# Patient Record
Sex: Female | Born: 1980 | Race: Black or African American | Hispanic: No | Smoking: Never smoker
Health system: Southern US, Community
[De-identification: ages and names within clinical notes are randomized; demographics above are authoritative.]

## PROBLEM LIST (undated history)

## (undated) DIAGNOSIS — Z789 Other specified health status: Secondary | ICD-10-CM

## (undated) HISTORY — PX: NO PAST SURGERIES: SHX2092

---

## 2006-10-07 ENCOUNTER — Emergency Department (HOSPITAL_COMMUNITY): Admission: EM | Admit: 2006-10-07 | Discharge: 2006-10-07 | Payer: Self-pay | Admitting: Family Medicine

## 2006-10-11 ENCOUNTER — Emergency Department (HOSPITAL_COMMUNITY): Admission: EM | Admit: 2006-10-11 | Discharge: 2006-10-11 | Payer: Self-pay | Admitting: Emergency Medicine

## 2006-10-19 ENCOUNTER — Emergency Department (HOSPITAL_COMMUNITY): Admission: EM | Admit: 2006-10-19 | Discharge: 2006-10-20 | Payer: Self-pay | Admitting: Emergency Medicine

## 2007-08-31 ENCOUNTER — Inpatient Hospital Stay (HOSPITAL_COMMUNITY): Admission: AD | Admit: 2007-08-31 | Discharge: 2007-08-31 | Payer: Self-pay | Admitting: Obstetrics & Gynecology

## 2008-04-01 ENCOUNTER — Inpatient Hospital Stay (HOSPITAL_COMMUNITY): Admission: AD | Admit: 2008-04-01 | Discharge: 2008-04-01 | Payer: Self-pay | Admitting: Obstetrics & Gynecology

## 2008-04-16 ENCOUNTER — Inpatient Hospital Stay (HOSPITAL_COMMUNITY): Admission: AD | Admit: 2008-04-16 | Discharge: 2008-04-16 | Payer: Self-pay | Admitting: Family Medicine

## 2008-04-20 ENCOUNTER — Inpatient Hospital Stay (HOSPITAL_COMMUNITY): Admission: AD | Admit: 2008-04-20 | Discharge: 2008-04-20 | Payer: Self-pay | Admitting: Obstetrics & Gynecology

## 2008-07-13 ENCOUNTER — Inpatient Hospital Stay (HOSPITAL_COMMUNITY): Admission: AD | Admit: 2008-07-13 | Discharge: 2008-07-14 | Payer: Self-pay | Admitting: Obstetrics & Gynecology

## 2009-06-18 ENCOUNTER — Emergency Department (HOSPITAL_COMMUNITY): Admission: EM | Admit: 2009-06-18 | Discharge: 2009-06-18 | Payer: Self-pay | Admitting: Emergency Medicine

## 2010-10-12 LAB — WET PREP, GENITAL: Trich, Wet Prep: NONE SEEN

## 2010-10-12 LAB — GC/CHLAMYDIA PROBE AMP, GENITAL: GC Probe Amp, Genital: NEGATIVE

## 2010-11-07 ENCOUNTER — Emergency Department (HOSPITAL_COMMUNITY)
Admission: EM | Admit: 2010-11-07 | Discharge: 2010-11-07 | Disposition: A | Discharge status: Home / Self Care | Payer: 59 | Attending: Emergency Medicine | Admitting: Emergency Medicine

## 2010-11-07 DIAGNOSIS — R51 Headache: Secondary | ICD-10-CM | POA: Insufficient documentation

## 2011-03-22 LAB — URINALYSIS, ROUTINE W REFLEX MICROSCOPIC
Glucose, UA: NEGATIVE
Protein, ur: 30 — AB
pH: 5.5

## 2011-03-22 LAB — URINE MICROSCOPIC-ADD ON

## 2011-03-29 LAB — POCT PREGNANCY, URINE: Preg Test, Ur: NEGATIVE

## 2011-03-29 LAB — URINALYSIS, ROUTINE W REFLEX MICROSCOPIC
Bilirubin Urine: NEGATIVE
Glucose, UA: NEGATIVE
Ketones, ur: NEGATIVE
Nitrite: NEGATIVE
pH: 6

## 2011-03-29 LAB — URINE MICROSCOPIC-ADD ON

## 2011-03-29 LAB — GC/CHLAMYDIA PROBE AMP, GENITAL
Chlamydia, DNA Probe: NEGATIVE
GC Probe Amp, Genital: NEGATIVE

## 2011-03-29 LAB — WET PREP, GENITAL
Clue Cells Wet Prep HPF POC: NONE SEEN
Trich, Wet Prep: NONE SEEN

## 2011-03-29 LAB — HERPES SIMPLEX VIRUS CULTURE

## 2011-06-01 ENCOUNTER — Inpatient Hospital Stay (HOSPITAL_COMMUNITY)
Admission: AD | Admit: 2011-06-01 | Discharge: 2011-06-01 | Disposition: A | Discharge status: Home / Self Care | Payer: 59 | Source: Ambulatory Visit | Attending: Obstetrics & Gynecology | Admitting: Obstetrics & Gynecology

## 2011-06-01 ENCOUNTER — Encounter (HOSPITAL_COMMUNITY): Payer: Self-pay | Admitting: *Deleted

## 2011-06-01 DIAGNOSIS — B9789 Other viral agents as the cause of diseases classified elsewhere: Secondary | ICD-10-CM | POA: Insufficient documentation

## 2011-06-01 DIAGNOSIS — B349 Viral infection, unspecified: Secondary | ICD-10-CM

## 2011-06-01 HISTORY — DX: Other specified health status: Z78.9

## 2011-06-01 NOTE — Progress Notes (Signed)
Patient states she has been having a sore throat and cough on 12-2, started having pain in the right ear yesterday.

## 2011-06-01 NOTE — ED Provider Notes (Signed)
History   Pt presents today c/o "not feeling well." She states on Sunday she began with a sore throat and now she has an earache. She denies fever, productive cough, chest pain, abd pain, or any other sx. She states her throat feels better than it did on Sunday.  Chief Complaint  Patient presents with  . Otalgia  . Sore Throat   Patient is a 30 y.o. female presenting with ear pain and pharyngitis.  Otalgia Pertinent negatives include no headaches, no abdominal pain, no diarrhea and no vomiting.  Sore Throat Pertinent negatives include no abdominal pain, chest pain, chills, fever, headaches, nausea or vomiting.    OB History    Grav Para Term Preterm Abortions TAB SAB Ect Mult Living   1    1 1     0      Past Medical History  Diagnosis Date  . No pertinent past medical history     Past Surgical History  Procedure Date  . No past surgeries     No family history on file.  History  Substance Use Topics  . Smoking status: Never Smoker   . Smokeless tobacco: Not on file  . Alcohol Use: No    Allergies: Allergies not on file  No prescriptions prior to admission    Review of Systems  Constitutional: Positive for malaise/fatigue. Negative for fever and chills.  HENT: Positive for ear pain.   Respiratory: Negative for hemoptysis, sputum production, shortness of breath and wheezing.   Cardiovascular: Negative for chest pain and palpitations.  Gastrointestinal: Negative for nausea, vomiting, abdominal pain, diarrhea and constipation.  Genitourinary: Negative for dysuria, urgency, frequency and hematuria.  Neurological: Negative for dizziness and headaches.  Psychiatric/Behavioral: Negative for depression and suicidal ideas.   Physical Exam   Blood pressure 116/71, temperature 99.5 F (37.5 C), temperature source Oral, resp. rate 20, height 5\' 7"  (1.702 m), weight 168 lb 6.4 oz (76.386 kg), last menstrual period 05/18/2011, SpO2 98.00%.  Physical Exam  Nursing note and  vitals reviewed. Constitutional: She is oriented to person, place, and time. She appears well-developed and well-nourished. No distress.  HENT:  Head: Normocephalic and atraumatic.  Right Ear: External ear normal.  Left Ear: External ear normal.  Nose: Nose normal.  Mouth/Throat: Oropharynx is clear and moist. No oropharyngeal exudate.  Eyes: EOM are normal. Pupils are equal, round, and reactive to light.  Cardiovascular: Normal rate, regular rhythm and normal heart sounds.  Exam reveals no gallop and no friction rub.   No murmur heard. Respiratory: Effort normal and breath sounds normal. No respiratory distress. She has no wheezes. She has no rales. She exhibits no tenderness.  GI: Soft. She exhibits no distension. There is no tenderness. There is no rebound and no guarding.  Neurological: She is alert and oriented to person, place, and time.  Skin: Skin is warm and dry. She is not diaphoretic.  Psychiatric: She has a normal mood and affect. Her behavior is normal. Judgment and thought content normal.    MAU Course  Procedures    Assessment and Plan  Viral syndrome: discussed with pt at length. She will f/u with her PCP. Discussed instructions and precautions.  Clinton Gallant. Tamim Skog III, DrHSc, MPAS, PA-C  06/01/2011, 9:40 AM   Henrietta Hoover, PA 06/01/11 0943  Henrietta Hoover, PA 06/01/11 0945

## 2011-07-21 ENCOUNTER — Encounter (HOSPITAL_COMMUNITY): Payer: Self-pay | Admitting: *Deleted

## 2011-07-21 ENCOUNTER — Emergency Department (HOSPITAL_COMMUNITY)
Admission: EM | Admit: 2011-07-21 | Discharge: 2011-07-21 | Disposition: A | Discharge status: Home / Self Care | Payer: 59 | Attending: Emergency Medicine | Admitting: Emergency Medicine

## 2011-07-21 DIAGNOSIS — J3489 Other specified disorders of nose and nasal sinuses: Secondary | ICD-10-CM | POA: Insufficient documentation

## 2011-07-21 DIAGNOSIS — R059 Cough, unspecified: Secondary | ICD-10-CM | POA: Insufficient documentation

## 2011-07-21 DIAGNOSIS — R070 Pain in throat: Secondary | ICD-10-CM | POA: Insufficient documentation

## 2011-07-21 DIAGNOSIS — IMO0001 Reserved for inherently not codable concepts without codable children: Secondary | ICD-10-CM | POA: Insufficient documentation

## 2011-07-21 DIAGNOSIS — R5383 Other fatigue: Secondary | ICD-10-CM | POA: Insufficient documentation

## 2011-07-21 DIAGNOSIS — R5381 Other malaise: Secondary | ICD-10-CM | POA: Insufficient documentation

## 2011-07-21 DIAGNOSIS — R05 Cough: Secondary | ICD-10-CM | POA: Insufficient documentation

## 2011-07-21 DIAGNOSIS — J111 Influenza due to unidentified influenza virus with other respiratory manifestations: Secondary | ICD-10-CM | POA: Insufficient documentation

## 2011-07-21 DIAGNOSIS — R6889 Other general symptoms and signs: Secondary | ICD-10-CM

## 2011-07-21 DIAGNOSIS — R509 Fever, unspecified: Secondary | ICD-10-CM | POA: Insufficient documentation

## 2011-07-21 DIAGNOSIS — H9209 Otalgia, unspecified ear: Secondary | ICD-10-CM | POA: Insufficient documentation

## 2011-07-21 DIAGNOSIS — R0789 Other chest pain: Secondary | ICD-10-CM | POA: Insufficient documentation

## 2011-07-21 MED ORDER — FLUTICASONE PROPIONATE 50 MCG/ACT NA SUSP: 2.0000 | Freq: Every day | NASAL | Status: AC

## 2011-07-21 MED ORDER — HYDROCODONE-HOMATROPINE 5-1.5 MG/5ML PO SYRP: 5.0000 mL | ORAL_SOLUTION | Freq: Four times a day (QID) | ORAL | Status: AC | PRN

## 2011-07-21 NOTE — ED Provider Notes (Signed)
Medical screening examination/treatment/procedure(s) were performed by non-physician practitioner and as supervising physician I was immediately available for consultation/collaboration.   Oma Marzan, MD 07/21/11 1538 

## 2011-07-21 NOTE — ED Notes (Signed)
Pt state she is having body aches, chills, body aches and ear aches

## 2011-07-21 NOTE — ED Provider Notes (Signed)
History     CSN: [id number]  Arrival date & time 07/21/11  1000   First MD Initiated Contact with Patient 07/21/11 1011      No chief complaint on file.   (Consider location/radiation/quality/duration/timing/severity/associated sxs/prior treatment) HPI Comments: Pt presents to the ED with complaints of flu-like symptoms of cough, congestion, sore throat, muscle aches, chills, fevers,left ear pain. . The patient states that the symptoms started Monday.  Pt has been around other sick contacts and did not get the flu shot this year. The patient denies headaches, neck pain, weakness, vision changes, severe abdominal pain, inability to eat or drink, difficulty breathing, SOB, wheezing, chest pain. The patient has tried cough medicine, NSAIDS, and rest but has only felt mild relief.    The history is provided by the patient.    Past Medical History  Diagnosis Date  . No pertinent past medical history     Past Surgical History  Procedure Date  . No past surgeries     No family history on file.  History  Substance Use Topics  . Smoking status: Never Smoker   . Smokeless tobacco: Not on file  . Alcohol Use: No    OB History    Grav Para Term Preterm Abortions TAB SAB Ect Mult Living   1    1 1     0      Review of Systems  Constitutional: Positive for fever, chills and fatigue.  HENT: Positive for congestion and rhinorrhea. Negative for ear pain, sore throat, sneezing, neck pain, neck stiffness, sinus pressure and tinnitus.   Eyes: Negative for visual disturbance.  Respiratory: Positive for cough and chest tightness.   Cardiovascular: Negative for chest pain and palpitations.  Gastrointestinal: Negative for nausea, vomiting, abdominal pain and diarrhea.  Genitourinary: Negative for dysuria.  Musculoskeletal: Positive for myalgias.  Skin: Negative for color change and rash.  Neurological: Negative for dizziness and weakness.  Hematological: Does not bruise/bleed easily.    Psychiatric/Behavioral: Negative for confusion.  All other systems reviewed and are negative.    Allergies  Penicillins  Home Medications   Current Outpatient Rx  Name Route Sig Dispense Refill  . NORGESTIM-ETH ESTRAD TRIPHASIC 0.18/0.215/0.25 MG-35 MCG PO TABS Oral Take 1 tablet by mouth daily.        BP 132/73  Pulse 90  Temp(Src) 98.9 F (37.2 C) (Oral)  Resp 18  SpO2 100%  LMP 07/18/2011  Physical Exam  Nursing note and vitals reviewed. Constitutional: She is oriented to person, place, and time. Vital signs are normal. She appears well-developed and well-nourished. No distress.  HENT:  Head: Normocephalic and atraumatic. No trismus in the jaw.  Right Ear: External ear normal. No drainage or tenderness. No mastoid tenderness.  Left Ear: External ear normal. No drainage or tenderness. No mastoid tenderness.  Nose: Nose normal. No rhinorrhea or sinus tenderness.  Mouth/Throat: Uvula is midline, oropharynx is clear and moist and mucous membranes are normal. No uvula swelling. No oropharyngeal exudate.  Eyes: Conjunctivae and EOM are normal. Right eye exhibits no discharge. Left eye exhibits no discharge. No scleral icterus.  Neck: Normal range of motion. Neck supple.  Cardiovascular: Normal rate, regular rhythm and normal heart sounds.   Pulmonary/Chest: Effort normal and breath sounds normal. No stridor. No respiratory distress. She has no wheezes. She exhibits tenderness.  Abdominal: Soft. There is no tenderness.  Musculoskeletal: Normal range of motion.  Neurological: She is alert and oriented to person, place, and time.  Skin: Skin  is warm and dry. No rash noted. She is not diaphoretic.  Psychiatric: She has a normal mood and affect. Her behavior is normal.    ED Course  Procedures (including critical care time)  Labs Reviewed - No data to display No results found.   No diagnosis found.    MDM  Flu like symptoms  Patient with symptoms consistent with  influenza.  Vitals are stable, low-grade fever.  No signs of dehydration, tolerating PO's.  Lungs are clear. Due to patient's presentation and physical exam a chest x-ray was not ordered bc likely diagnosis of flu.  Discussed the cost versus benefit of Tamiflu treatment with the patient.  The patient understands that symptoms are greater than the recommended 24-48 hour window of treatment.  Patient will be discharged with instructions to orally hydrate, rest, and use over-the-counter medications such as anti-inflammatories ibuprofen and Aleve for muscle aches and Tylenol for fever.  Patient will also be given a cough suppressant.       Jaci Carrel, [place] 07/21/11 1044

## 2011-08-02 ENCOUNTER — Emergency Department (HOSPITAL_COMMUNITY)
Admission: EM | Admit: 2011-08-02 | Discharge: 2011-08-02 | Disposition: A | Discharge status: Home / Self Care | Payer: 59 | Attending: Emergency Medicine | Admitting: Emergency Medicine

## 2011-08-02 ENCOUNTER — Encounter (HOSPITAL_COMMUNITY): Payer: Self-pay | Admitting: *Deleted

## 2011-08-02 DIAGNOSIS — R05 Cough: Secondary | ICD-10-CM | POA: Insufficient documentation

## 2011-08-02 DIAGNOSIS — J329 Chronic sinusitis, unspecified: Secondary | ICD-10-CM | POA: Insufficient documentation

## 2011-08-02 DIAGNOSIS — R059 Cough, unspecified: Secondary | ICD-10-CM | POA: Insufficient documentation

## 2011-08-02 MED ORDER — AZITHROMYCIN 250 MG PO TABS: 250.0000 mg | ORAL_TABLET | Freq: Every day | ORAL | Status: DC

## 2011-08-02 MED ORDER — FLUCONAZOLE 200 MG PO TABS: 200.0000 mg | ORAL_TABLET | Freq: Every day | ORAL | Status: DC

## 2011-08-02 MED ORDER — ONDANSETRON HCL 4 MG PO TABS: 4.0000 mg | ORAL_TABLET | Freq: Four times a day (QID) | ORAL | Status: DC

## 2011-08-02 MED ORDER — BENZONATATE 100 MG PO CAPS: 100.0000 mg | ORAL_CAPSULE | Freq: Three times a day (TID) | ORAL | Status: AC

## 2011-08-02 MED ORDER — ONDANSETRON HCL 4 MG PO TABS: 4.0000 mg | ORAL_TABLET | Freq: Four times a day (QID) | ORAL | Status: AC

## 2011-08-02 MED ORDER — BENZONATATE 100 MG PO CAPS: 100.0000 mg | ORAL_CAPSULE | Freq: Three times a day (TID) | ORAL | Status: DC

## 2011-08-02 MED ORDER — FLUCONAZOLE 200 MG PO TABS: 200.0000 mg | ORAL_TABLET | Freq: Every day | ORAL | Status: AC

## 2011-08-02 MED ORDER — AZITHROMYCIN 250 MG PO TABS: 250.0000 mg | ORAL_TABLET | Freq: Every day | ORAL | Status: AC

## 2011-08-02 NOTE — ED Notes (Signed)
Pt reports has had "flu" x 2 weeks. Reports continued productive cough/vomiting. Reports given cough syrup and nasal spray, unable to take due to vomiting. States vomitted x 1 yesterday. Denies fever.

## 2011-08-02 NOTE — ED Provider Notes (Signed)
History     CSN: [id number]  Arrival date & time 08/02/11  0913   First MD Initiated Contact with Patient 08/02/11 0935      Chief Complaint  Patient presents with  . Cough    (Consider location/radiation/quality/duration/timing/severity/associated sxs/prior treatment) HPI  Pt presents to the ED with complaints of "still not feeling better". She states that she was seen two weeks ago and diagnosed with the flu. Since then she has been waking up with a sore throat, has vomited approximately 3 times within the past two weeks each time after coughing. The patient states that she was given a nasal spray and some pain medication but it made her feel worse. She describes having nasal dischargewith a green tinge. She also complains of a sore throat when she wakes up in the morning with coughing and nasal congestion. Denies abdominal pain, nausea. Pt states that her symptoms have markedly improved and she does not feel "that bad" however, her work requested that she come to get seen.  Past Medical History  Diagnosis Date  . No pertinent past medical history     Past Surgical History  Procedure Date  . No past surgeries     No family history on file.  History  Substance Use Topics  . Smoking status: Never Smoker   . Smokeless tobacco: Not on file  . Alcohol Use: No    OB History    Grav Para Term Preterm Abortions TAB SAB Ect Mult Living   1    1 1     0      Review of Systems  All other systems reviewed and are negative.    Allergies  Penicillins  Home Medications   Current Outpatient Rx  Name Route Sig Dispense Refill  . FLUTICASONE PROPIONATE 50 MCG/ACT NA SUSP Nasal Place 2 sprays into the nose daily. 16 g 2  . HYDROCODONE-HOMATROPINE 5-1.5 MG/5ML PO SYRP Oral Take 5 mLs by mouth every 6 (six) hours as needed. For cough or pain    . NORGESTIM-ETH ESTRAD TRIPHASIC 0.18/0.215/0.25 MG-35 MCG PO TABS Oral Take 1 tablet by mouth daily.      Marland Kitchen PHENYLEPHRINE-DM-GG-APAP  5-10-200-325 MG PO TABS Oral Take 2 tablets by mouth every 6 (six) hours. For cold    . AZITHROMYCIN 250 MG PO TABS Oral Take 1 tablet (250 mg total) by mouth daily. Take first 2 tablets together, then 1 every day until finished. 6 tablet 0  . BENZONATATE 100 MG PO CAPS Oral Take 1 capsule (100 mg total) by mouth every 8 (eight) hours. 21 capsule 0  . FLUCONAZOLE 200 MG PO TABS Oral Take 1 tablet (200 mg total) by mouth daily. 7 tablet 0  . ONDANSETRON HCL 4 MG PO TABS Oral Take 1 tablet (4 mg total) by mouth every 6 (six) hours. 12 tablet 0    BP 120/81  Pulse 78  Temp(Src) 97.6 F (36.4 C) (Oral)  Resp 16  Ht 5\' 6"  (1.676 m)  SpO2 100%  LMP 07/18/2011  Physical Exam  Nursing note and vitals reviewed. Constitutional: She appears well-developed and well-nourished.  HENT:  Head: Normocephalic and atraumatic.  Right Ear: Tympanic membrane and ear canal normal.  Left Ear: Tympanic membrane and ear canal normal.  Nose: Rhinorrhea and sinus tenderness present. No nasal deformity, septal deviation or nasal septal hematoma. No epistaxis. Right sinus exhibits maxillary sinus tenderness and frontal sinus tenderness. Left sinus exhibits maxillary sinus tenderness and frontal sinus tenderness.  Eyes: Conjunctivae  are normal. Pupils are equal, round, and reactive to light.  Neck: Trachea normal, normal range of motion and full passive range of motion without pain. Neck supple.  Cardiovascular: Normal rate, regular rhythm and normal pulses.   Pulmonary/Chest: Effort normal and breath sounds normal. She has no wheezes. She has no rales. Chest wall is not dull to percussion. She exhibits no tenderness, no crepitus, no edema, no deformity and no retraction.  Abdominal: Soft. Normal appearance and bowel sounds are normal.  Musculoskeletal: Normal range of motion.  Neurological: She is alert. She has normal strength.  Skin: Skin is warm, dry and intact.  Psychiatric: Her speech is normal. Cognition and  memory are normal.    ED Course  Procedures (including critical care time)  Labs Reviewed - No data to display No results found.   1. Sinusitis       MDM  Pts second visit to the ED and having continued complaints of nasal congestion and cough in the morning. I have Rx pt Tessalon Perls, Azithromycin and Zofran. Pt to return to the ED prn if symptoms do not resolve.        Dorthula Matas, PA 08/02/11 1051

## 2011-08-03 NOTE — ED Provider Notes (Signed)
Medical screening examination/treatment/procedure(s) were performed by non-physician practitioner and as supervising physician I was immediately available for consultation/collaboration.   Tiana Sivertson M Pranit Owensby, DO 08/03/11 1045 

## 2014-04-29 ENCOUNTER — Encounter (HOSPITAL_COMMUNITY): Payer: Self-pay | Admitting: *Deleted

## 2015-10-17 ENCOUNTER — Other Ambulatory Visit: Payer: Self-pay | Admitting: Gastroenterology

## 2015-10-17 DIAGNOSIS — R112 Nausea with vomiting, unspecified: Secondary | ICD-10-CM

## 2015-10-27 ENCOUNTER — Ambulatory Visit
Admission: RE | Admit: 2015-10-27 | Discharge: 2015-10-27 | Disposition: A | Discharge status: Home / Self Care | Payer: Self-pay | Source: Ambulatory Visit | Attending: Gastroenterology | Admitting: Gastroenterology

## 2015-10-27 DIAGNOSIS — R112 Nausea with vomiting, unspecified: Secondary | ICD-10-CM

## 2015-10-29 ENCOUNTER — Other Ambulatory Visit: Payer: Self-pay | Admitting: Periodontics

## 2015-10-30 ENCOUNTER — Other Ambulatory Visit: Payer: Self-pay | Admitting: Gastroenterology

## 2015-10-30 DIAGNOSIS — N281 Cyst of kidney, acquired: Secondary | ICD-10-CM

## 2015-11-11 ENCOUNTER — Ambulatory Visit
Admission: RE | Admit: 2015-11-11 | Discharge: 2015-11-11 | Disposition: A | Discharge status: Home / Self Care | Payer: BLUE CROSS/BLUE SHIELD | Source: Ambulatory Visit | Attending: Gastroenterology | Admitting: Gastroenterology

## 2015-11-11 DIAGNOSIS — N281 Cyst of kidney, acquired: Secondary | ICD-10-CM

## 2015-11-11 MED ORDER — GADOBENATE DIMEGLUMINE 529 MG/ML IV SOLN
18.0000 mL | Freq: Once | INTRAVENOUS | Status: AC | PRN
  Administered 2015-11-11: 18 mL via INTRAVENOUS

## 2016-06-01 ENCOUNTER — Other Ambulatory Visit: Payer: Self-pay | Admitting: Gastroenterology

## 2016-06-01 DIAGNOSIS — K769 Liver disease, unspecified: Secondary | ICD-10-CM

## 2016-06-11 ENCOUNTER — Other Ambulatory Visit: Payer: BLUE CROSS/BLUE SHIELD

## 2016-06-24 ENCOUNTER — Ambulatory Visit
Admission: RE | Admit: 2016-06-24 | Discharge: 2016-06-24 | Disposition: A | Discharge status: Home / Self Care | Payer: BLUE CROSS/BLUE SHIELD | Source: Ambulatory Visit | Attending: Gastroenterology | Admitting: Gastroenterology

## 2016-06-24 DIAGNOSIS — K769 Liver disease, unspecified: Secondary | ICD-10-CM

## 2016-06-24 MED ORDER — GADOXETATE DISODIUM 0.25 MMOL/ML IV SOLN
8.0000 mL | Freq: Once | INTRAVENOUS | Status: AC | PRN
  Administered 2016-06-24: 8 mL via INTRAVENOUS

## 2018-02-08 ENCOUNTER — Inpatient Hospital Stay (HOSPITAL_COMMUNITY)
Admission: AD | Admit: 2018-02-08 | Discharge: 2018-02-08 | Disposition: A | Discharge status: Home / Self Care | Payer: BLUE CROSS/BLUE SHIELD | Source: Ambulatory Visit | Attending: Obstetrics and Gynecology | Admitting: Obstetrics and Gynecology

## 2018-02-08 ENCOUNTER — Other Ambulatory Visit: Payer: Self-pay

## 2018-02-08 ENCOUNTER — Encounter (HOSPITAL_COMMUNITY): Payer: Self-pay | Admitting: *Deleted

## 2018-02-08 DIAGNOSIS — O26851 Spotting complicating pregnancy, first trimester: Secondary | ICD-10-CM

## 2018-02-08 DIAGNOSIS — Z880 Allergy status to penicillin: Secondary | ICD-10-CM | POA: Diagnosis not present

## 2018-02-08 DIAGNOSIS — Z3202 Encounter for pregnancy test, result negative: Secondary | ICD-10-CM

## 2018-02-08 HISTORY — DX: Other specified health status: Z78.9

## 2018-02-08 LAB — URINALYSIS, ROUTINE W REFLEX MICROSCOPIC
BILIRUBIN URINE: NEGATIVE
Glucose, UA: NEGATIVE mg/dL
KETONES UR: NEGATIVE mg/dL
LEUKOCYTES UA: NEGATIVE
NITRITE: NEGATIVE
Protein, ur: NEGATIVE mg/dL
Specific Gravity, Urine: 1.009 (ref 1.005–1.030)
pH: 7 (ref 5.0–8.0)

## 2018-02-08 LAB — POCT PREGNANCY, URINE: PREG TEST UR: NEGATIVE

## 2018-02-08 NOTE — MAU Provider Note (Signed)
History     CSN: [id number]  Arrival date and time: 02/08/18 1709   First Provider Initiated Contact with Patient 02/08/18 1748      Chief Complaint  Patient presents with  . Possible Pregnancy  . Vaginal Bleeding   HPI  Jennifer Silva is a 37 y.o. patient who presents to MAU with chief complaint of spotting in early pregnancy in the setting of LMP 02/05/18 and two negative home pregnancy tests. Patient states she called her GYN provider and was assured that the home results were reliable but she still felt concerned and desires pregnancy confirmation today.  OB History    Gravida  1   Para      Term      Preterm      AB  1   Living  0     SAB      TAB  1   Ectopic      Multiple      Live Births              Past Medical History:  Diagnosis Date  . Medical history non-contributory   . No pertinent past medical history     Past Surgical History:  Procedure Laterality Date  . NO PAST SURGERIES      History reviewed. No pertinent family history.  Social History   Tobacco Use  . Smoking status: Never Smoker  . Smokeless tobacco: Never Used  Substance Use Topics  . Alcohol use: Yes    Alcohol/week: 7.0 standard drinks    Types: 7 Glasses of wine per week    Comment: one glass of wine per day  . Drug use: No    Allergies:  Allergies  Allergen Reactions  . Penicillins Other (See Comments)    Reaction unknown    Medications Prior to Admission  Medication Sig Dispense Refill Last Dose  . fluticasone (FLONASE) 50 MCG/ACT nasal spray Place 2 sprays into the nose daily. 16 g 2 Past Week at Unknown  . HYDROcodone-homatropine (HYCODAN) 5-1.5 MG/5ML syrup Take 5 mLs by mouth every 6 (six) hours as needed. For cough or pain   Past Week at Unknown  . Norgestimate-Ethinyl Estradiol Triphasic (TRI-SPRINTEC) 0.18/0.215/0.25 MG-35 MCG tablet Take 1 tablet by mouth daily.     08/01/2011 at Unknown  . Phenylephrine-DM-GG-APAP (TYLENOL COLD/FLU SEVERE)  5-10-200-325 MG TABS Take 2 tablets by mouth every 6 (six) hours. For cold   Past Week at Unknown    Review of Systems  Genitourinary: Positive for vaginal bleeding.  All other systems reviewed and are negative.  Physical Exam   Blood pressure (!) 140/92, pulse 73, temperature 98.5 F (36.9 C), resp. rate 17, weight 79.9 kg, last menstrual period 01/03/2018, SpO2 99 %.  Physical Exam  Nursing note and vitals reviewed. Constitutional: She is oriented to person, place, and time. She appears well-developed and well-nourished.  Respiratory: Effort normal.  Genitourinary:  Genitourinary Comments: Scant vaginal discharge  Neurological: She is alert and oriented to person, place, and time.  Skin: Skin is warm and dry.  Psychiatric: She has a normal mood and affect. Her behavior is normal. Judgment and thought content normal.    MAU Course  Procedures  MDM --negative pregnancy test in MAU Patient Vitals for the past 24 hrs:  BP Temp Pulse Resp SpO2 Weight  02/08/18 1726 (!) 140/92 98.5 F (36.9 C) 73 17 99 % 79.9 kg    Results for orders placed or performed during the hospital  encounter of 02/08/18 (from the past 24 hour(s))  Pregnancy, urine POC     Status: None   Collection Time: 02/08/18  5:41 PM  Result Value Ref Range   Preg Test, Ur NEGATIVE NEGATIVE    Assessment and Plan  --37 y.o. G1P0010  --Negative HPT x two --Negative pregnancy test in MAU, likely irregular period --Discharge home in stable condition, approval received from Dr. Lelon Frohlichallahan  Samantha C Weinhold, CNM 02/08/2018, 5:57 PM

## 2018-02-08 NOTE — Discharge Instructions (Signed)
Pregnancy Test Information °What is a pregnancy test? °A pregnancy test is used to detect the presence of human chorionic gonadotropin (hCG) in a sample of your urine or blood. hCG is a hormone produced by the cells of the placenta. The placenta is the organ that forms to nourish and support a developing baby. °This test requires a sample of either blood or urine. A pregnancy test determines whether you are pregnant or not. °How are pregnancy tests done? °Pregnancy tests are done using a home pregnancy test or having a blood or urine test done at your health care provider's office. °Home pregnancy tests require a urine sample. °· Most kits use a plastic testing device with a strip of paper that indicates whether there is hCG in your urine. °· Follow the test instructions very carefully. °· After you urinate on the test stick, markings will appear to let you know whether you are pregnant. °· For best results, use your first urine of the morning. That is when the concentration of hCG is highest. ° °Having a blood test to check for pregnancy requires a sample of blood drawn from a vein in your hand or arm. Your health care provider will send your sample to a lab for testing. Results of a pregnancy test will be positive or negative. °Is one type of pregnancy test better than another? °In some cases, a blood test will return a positive result even if a urine test was negative because blood tests are more sensitive. This means blood tests can detect hCG earlier than home pregnancy tests. °How accurate are home pregnancy tests? °Both types of pregnancy tests are very accurate. °· A blood test is about 98% accurate. °· When you are far enough along in your pregnancy and when used correctly, home pregnancy tests are equally accurate. ° °Can anything interfere with home pregnancy test results °It is possible for certain conditions to cause an inaccurate test result (false positive or false negative). °· A false positive is a  positive test result when you are not pregnant. This can happen if you: °? Are taking certain medicines, including anticonvulsants or tranquilizers. °? Have certain proteins in your blood. °· A false negative is a negative test result when you are pregnant. This can happen if you: °? Took the test before there was enough hCG to detect. A pregnancy test will not be positive in most women until 3-4 weeks after conception. °? Drank a lot of liquid before the test. Diluted urine samples can sometimes give an inaccurate result. °? Take certain medicines, such as water pills (diuretics) or some antihistamines. ° °What should I do if I have a positive pregnancy test? °If you have a positive pregnancy test, schedule an appointment with your health care provider. You might need additional testing to confirm the pregnancy. In the meantime, begin taking a prenatal vitamin, stop smoking, stop drinking alcohol, and do not use street drugs. °Talk to your health care provider about how to take care of yourself during your pregnancy. Ask about what to expect from the care you will need throughout pregnancy (prenatal care). °This information is not intended to replace advice given to you by your health care provider. Make sure you discuss any questions you have with your health care provider. °Document Released: 06/17/2003 Document Revised: 05/11/2016 Document Reviewed: 10/09/2013 °Elsevier Interactive Patient Education © 2018 [employer] ° °

## 2018-02-08 NOTE — MAU Note (Signed)
Had symptoms of what felt like cycle on the 11th, felt like it, but nothing really was coming out.  Has had some spotting. Today passed a ? Large brown clot. 2 neg HPT. Cramps, but not painful.

## 2018-08-06 IMAGING — MR MR ABDOMEN WO/W CM
7 of 15 series · 23 of 48 positions shown · IV contrast (8ml eovist)
Comparison: Ultrasound 10/27/2015 and MRI 11/11/2015.

CLINICAL DATA: 35-year-old with hypervascular hepatic lesions on
previous MRI. No history of malignancy.

EXAM:
MRI ABDOMEN WITHOUT AND WITH CONTRAST
TECHNIQUE: Multiplanar multisequence MR imaging of the abdomen was performed
both before and after the administration of intravenous contrast.
CONTRAST:  8 ml Eovist.

[Series 4: cor haste · coronal · 5.0mm · 0.74mm/px · 2 of 37 slices shown]
[im 1/37]
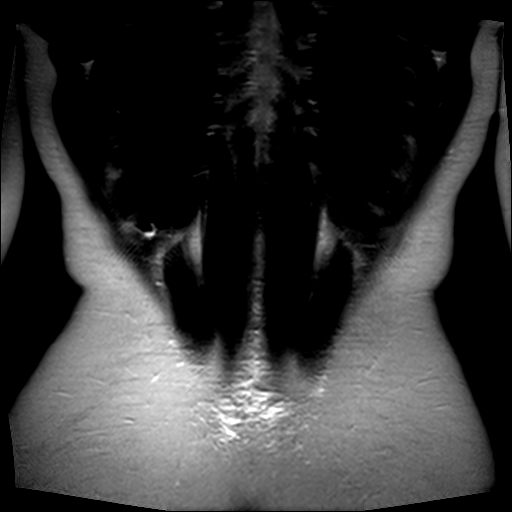
[im 37/37]
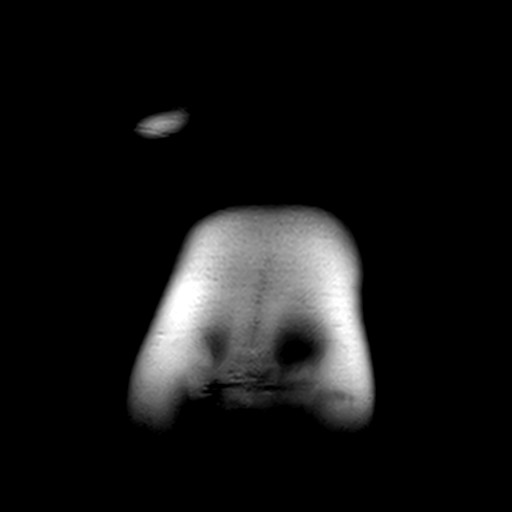

[Series 5: T1 · axial · 6.0mm · 0.74mm/px · z∈[-102,+92]mm · 3 of 56 slices shown]
[im 1/56]
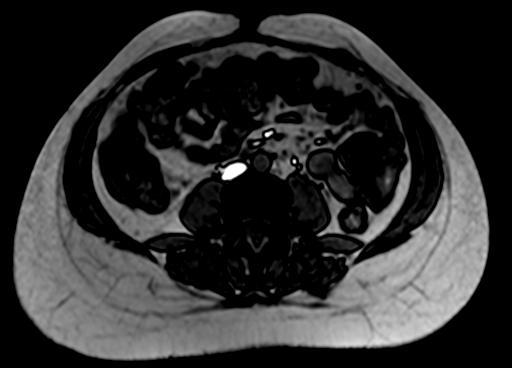
[im 28/56]
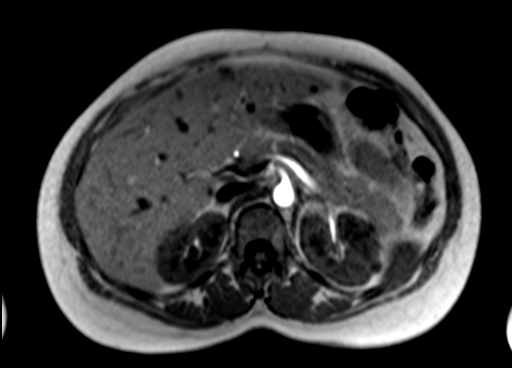
[im 56/56]
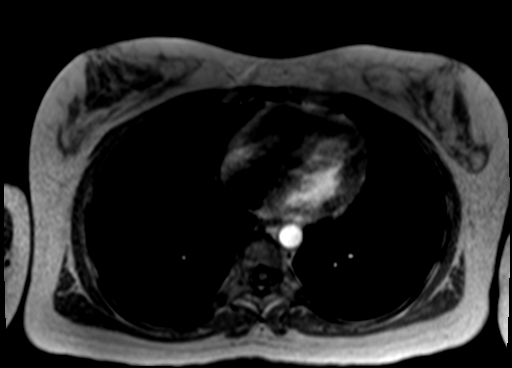

[Series 6: T1 dynamic · axial · non-contrast · 2.5mm · 0.78mm/px · z∈[-104,+93]mm · 4 of 80 slices shown]
[im 1/80]
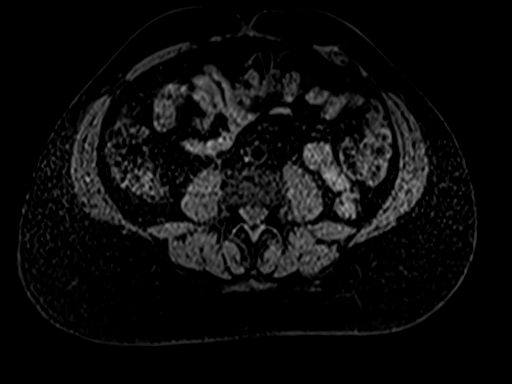
[im 27/80]
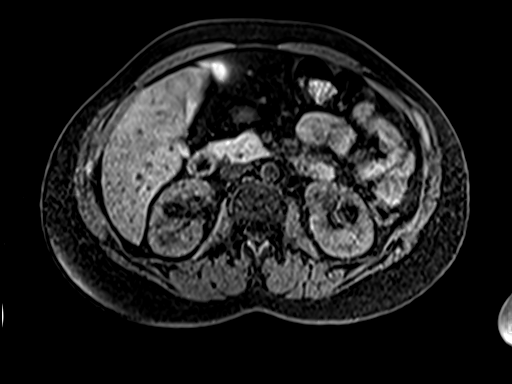
[im 53/80]
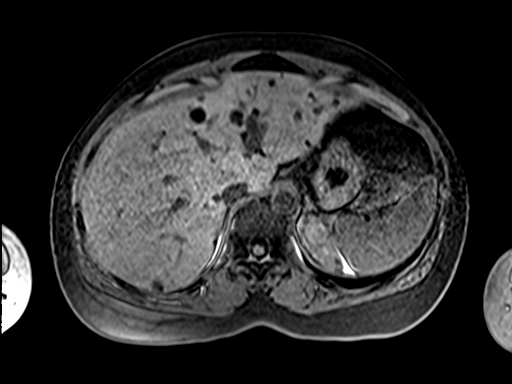
[im 80/80]
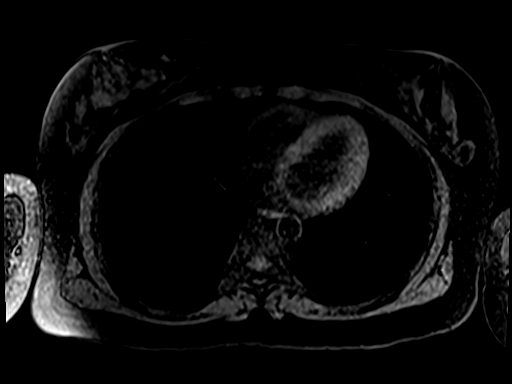

[Series 7: T1 dynamic post-contrast · axial · 2.5mm · 0.78mm/px · z∈[-104,+93]mm · 4 of 80 slices shown (1 of 4)]
[im 1/80]
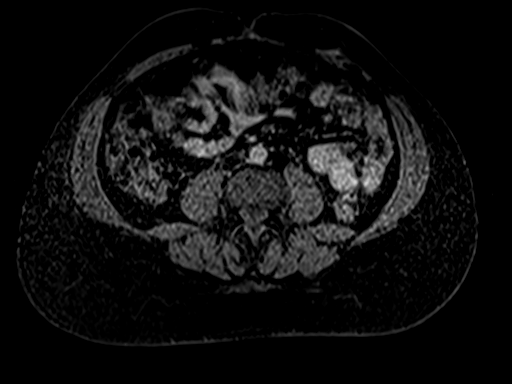
[im 27/80]
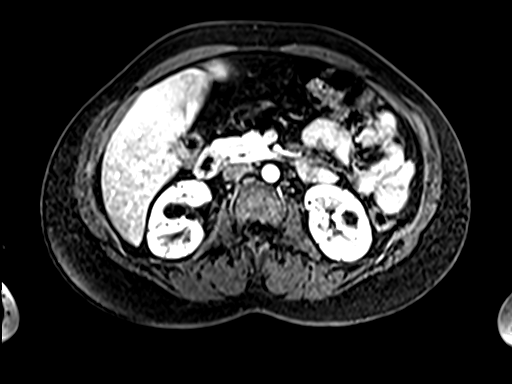
[im 53/80]
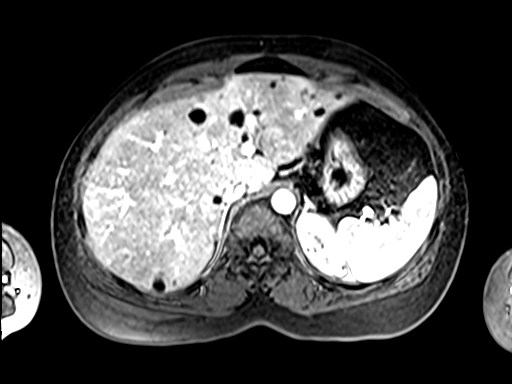
[im 80/80]
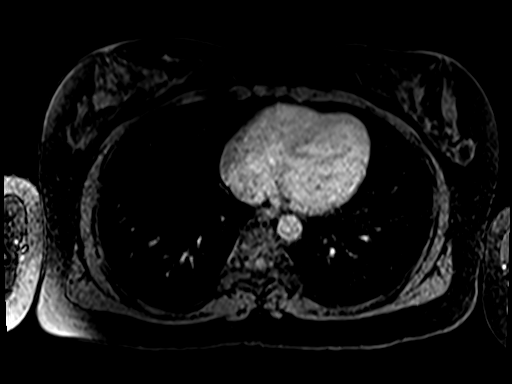

[Series 8: T1 dynamic post-contrast · axial · 2.5mm · 0.78mm/px · z∈[-104,+93]mm · 4 of 80 slices shown (2 of 4)]
[im 1/80]
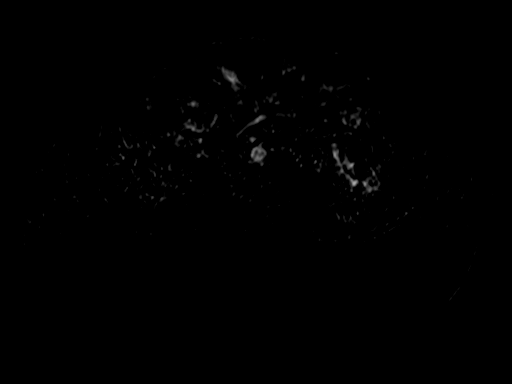
[im 27/80]
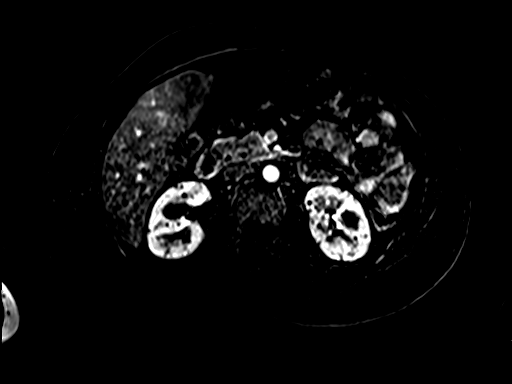
[im 53/80]
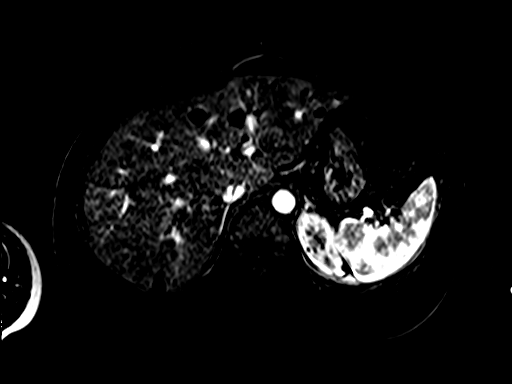
[im 80/80]
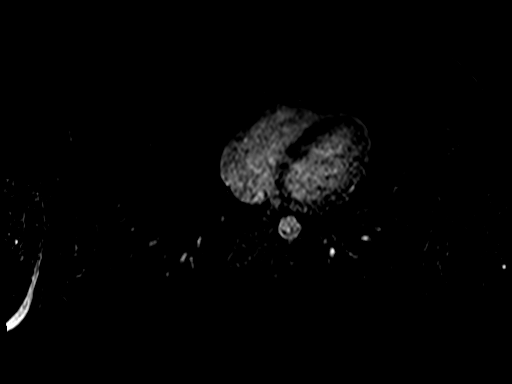

[Series 9: T1 dynamic post-contrast · axial · 2.5mm · 0.78mm/px · z∈[-104,+93]mm · 4 of 80 slices shown (3 of 4)]
[im 1/80]
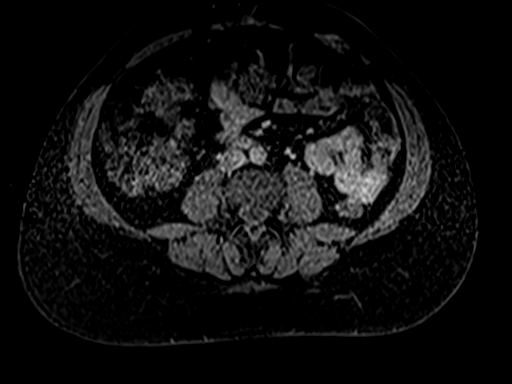
[im 27/80]
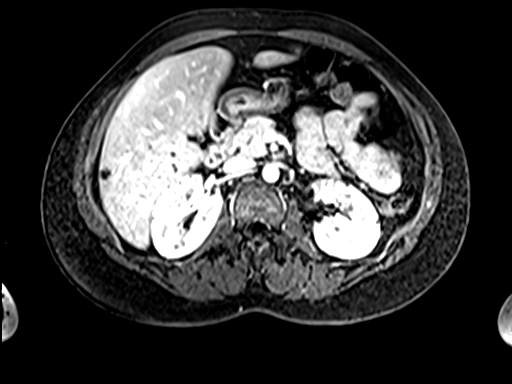
[im 53/80]
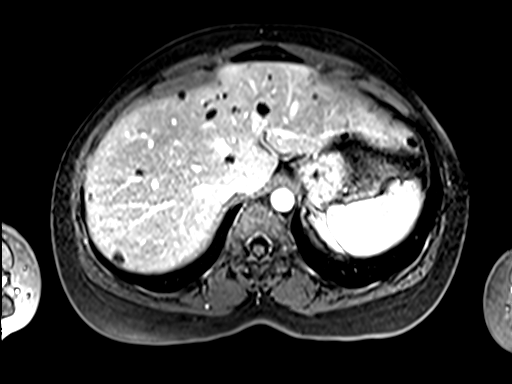
[im 80/80]
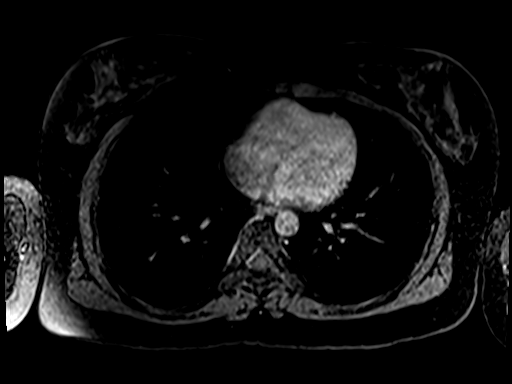

[Series 10: T1 dynamic post-contrast · axial · 2.5mm · 0.78mm/px · z∈[-104,-39]mm · 2 of 80 slices shown (4 of 4)]
[im 1/80]
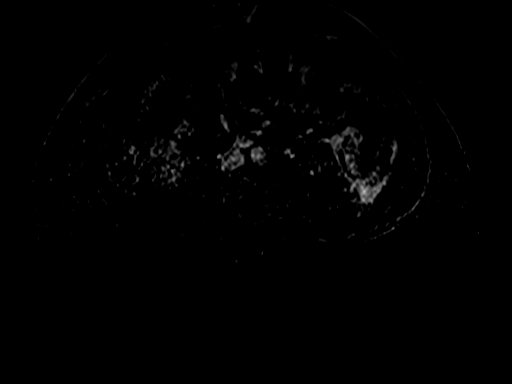
[im 27/80]
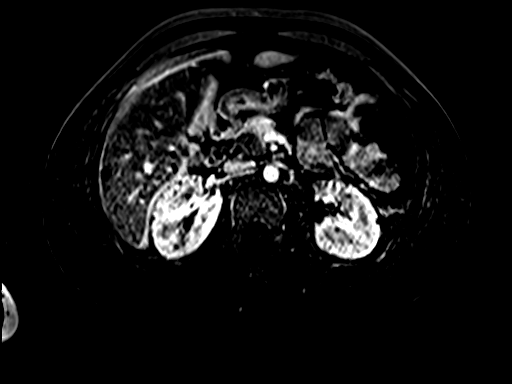

[23 of 48 positions shown; findings below may reference images not displayed]

FINDINGS: Lower chest: The visualized lower chest appears unremarkable. No
pleural or pericardial effusion.

Hepatobiliary: Again demonstrated is severe heterogeneous hepatic
steatosis. There is heterogeneous loss of signal on the gradient
echo opposed phase images, greatest within the right lobe.
Innumerable hepatic cystic lesions are again noted. These all appear
simple, demonstrating no enhancement or thickened septations.
Largest cystic lesion measures 2.0 x 2.2 cm in the lateral segment
of the left lobe. The previously noted hypervascular lesions appear
unchanged, largest adjacent to the dominant cyst in the left lobe,
measuring 2.3 x 1.7 cm on image 15 of series 7. Additional tiny
hypervascular lesions are seen inferiorly in the right lobe. All of
these lesions become less conspicuous on the delayed images, and are
inapparent on hepatobiliary phase images. There are no worrisome
hepatic lesions. The gallbladder appears normal. There is no biliary
dilatation. There is excretion of Eovist into the biliary system on
the hepatobiliary phase images.

Pancreas: Unremarkable. No pancreatic ductal dilatation or
surrounding inflammatory changes.

Spleen: Normal in size without focal abnormality.

Adrenals/Urinary Tract: Both adrenal glands appear normal. Again
demonstrated are innumerable renal cysts, the majority of which are
simple. There are few that are minimally septated. The largest
measure 17 mm in the mid right kidney and 15 mm in the mid left
kidney. No enhancing renal mass or hydronephrosis. Renal size is
stable, within normal limits.

Stomach/Bowel: No evidence of bowel wall thickening, distention or
surrounding inflammatory change.

Vascular/Lymphatic: There are no enlarged abdominal lymph nodes. No
significant vascular findings are present.

Other: No ascites.

Musculoskeletal: No acute or significant osseous findings.
IMPRESSION: 1. Stable abdominal MRI. Scattered small hepatic hypervascular
lesions are only seen on the immediate postcontrast images and are
likely incidental vascular shunts.
2. Again demonstrated are innumerable cystic lesions throughout the
liver and both kidneys. The biliary lesions could reflect multiple
biliary hamartomas.
3. Severe heterogeneous hepatic steatosis.

## 2018-08-31 ENCOUNTER — Ambulatory Visit
Admission: EM | Admit: 2018-08-31 | Discharge: 2018-08-31 | Disposition: A | Discharge status: Home / Self Care | Payer: BLUE CROSS/BLUE SHIELD | Attending: Family Medicine | Admitting: Family Medicine

## 2018-08-31 ENCOUNTER — Encounter: Payer: Self-pay | Admitting: Emergency Medicine

## 2018-08-31 DIAGNOSIS — R0981 Nasal congestion: Secondary | ICD-10-CM | POA: Diagnosis not present

## 2018-08-31 MED ORDER — MUPIROCIN CALCIUM 2 % NA OINT: TOPICAL_OINTMENT | NASAL | 0 refills | Status: AC

## 2018-08-31 NOTE — Discharge Instructions (Addendum)
Declines nasal swab today to test for potential MRSA Will treat empirically for potential MRSA based on symptoms and physical exam Avoid touching or picking at nose Follow up with Jennifer Courts FNP for further evaluation and management Return or go to the ED if you have any new or worsening symptoms such as fever, chills, increased redness, swelling, discharge from nose, nausea, vomiting, abdominal pain, changes in bowel or bladder habits, etc..Marland Kitchen

## 2018-08-31 NOTE — ED Notes (Signed)
Patient able to ambulate independently  

## 2018-08-31 NOTE — ED Provider Notes (Signed)
[facility]   [id number] 08/31/18 Arrival Time: 1710   CC: URI symptoms   SUBJECTIVE: History from: patient.  Jennifer Silva is a 38 y.o. female who presents with sores in nose x 2 months.  Symptoms began after having her septum pierced in December of 2019.  Had septum piercing removed 1 month ago.  Has tried peroxide and aquafore without relief.  Denies similar symptoms in the past.  Reports increased production of "boogers."  Denies fever, chills, fatigue, sinus pain, rhinorrhea, sore throat, SOB, wheezing, chest pain, nausea, changes in bowel or bladder habits.    ROS: As per HPI.  Past Medical History:  Diagnosis Date  . Medical history non-contributory   . No pertinent past medical history    Past Surgical History:  Procedure Laterality Date  . NO PAST SURGERIES     Allergies  Allergen Reactions  . Penicillins Other (See Comments)    Reaction unknown   No current facility-administered medications on file prior to encounter.    Current Outpatient Medications on File Prior to Encounter  Medication Sig Dispense Refill  . fluticasone (FLONASE) 50 MCG/ACT nasal spray Place 2 sprays into the nose daily. 16 g 2  . Norgestimate-Ethinyl Estradiol Triphasic (TRI-SPRINTEC) 0.18/0.215/0.25 MG-35 MCG tablet Take 1 tablet by mouth daily.       Social History   Socioeconomic History  . Marital status: Divorced    Spouse name: Not on file  . Number of children: Not on file  . Years of education: Not on file  . Highest education level: Not on file  Occupational History  . Not on file  Social Needs  . Financial resource strain: Not on file  . Food insecurity:    Worry: Not on file    Inability: Not on file  . Transportation needs:    Medical: Not on file    Non-medical: Not on file  Tobacco Use  . Smoking status: Never Smoker  . Smokeless tobacco: Never Used  Substance and Sexual Activity  . Alcohol use: Yes    Alcohol/week: 7.0 standard drinks    Types: 7  Glasses of wine per week    Comment: one glass of wine per day  . Drug use: No  . Sexual activity: Yes    Birth control/protection: Pill  Lifestyle  . Physical activity:    Days per week: Not on file    Minutes per session: Not on file  . Stress: Not on file  Relationships  . Social connections:    Talks on phone: Not on file    Gets together: Not on file    Attends religious service: Not on file    Active member of club or organization: Not on file    Attends meetings of clubs or organizations: Not on file    Relationship status: Not on file  . Intimate partner violence:    Fear of current or ex partner: Not on file    Emotionally abused: Not on file    Physically abused: Not on file    Forced sexual activity: Not on file  Other Topics Concern  . Not on file  Social History Narrative  . Not on file   History reviewed. No pertinent family history.  OBJECTIVE:  Vitals:   08/31/18 1741  BP: (!) 151/103  Pulse: 83  Resp: 18  Temp: 98.2 F (36.8 C)  TempSrc: Oral  SpO2: 98%     General appearance: alert; well-appearing; speaking in full sentences  and tolerating own secretions HEENT: NCAT; Ears: EACs clear, TMs pearly gray; Eyes: PERRL.  EOM grossly intact. Nose: nares patent turbinates swollen and erythematous with possible white exudates, no obvious drainage or bleeding, Throat: oropharynx clear, tonsils non erythematous or enlarged, uvula midline  Neck: supple without LAD Lungs: CTAB Heart: regular rate and rhythm.  Radial pulses 2+ symmetrical bilaterally Skin: warm and dry Psychological: alert and cooperative; normal mood and affect  ASSESSMENT & PLAN:  1. Nasal congestion     Meds ordered this encounter  Medications  . mupirocin nasal ointment (BACTROBAN) 2 %    Sig: apply one-half of single-use 1 g tube into each nostril TOPICALLY twice daily for 5 days    Dispense:  5 g    Refill:  0    Order Specific Question:   Supervising Provider    Answer:    Eustace Moore [[id number]]   Declines nasal swab today to test for potential MRSA Will treat empirically for potential MRSA based on symptoms and physical exam Avoid touching or picking at nose Follow up with Joaquin Courts FNP for further evaluation and management Return or go to the ED if you have any new or worsening symptoms such as fever, chills, increased redness, swelling, discharge from nose, nausea, vomiting, abdominal pain, changes in bowel or bladder habits, etc...  Reviewed expectations re: course of current medical issues. Questions answered. Outlined signs and symptoms indicating need for more acute intervention. Patient verbalized understanding. After Visit Summary given.         Alvino Chapel [place], PA-C 09/01/18 [phone]

## 2018-08-31 NOTE — ED Triage Notes (Signed)
Pt presents to [facility] for assessment after getting her nasal septum pierced approx 1.5 months ago.  States after about a week she started having an increased production of "boogers", which she states has not cleared up since.  States she took the piercing out 1 month ago, has been cleaning the inside of her nose daily with hydrogen peroxide, and the "boogers" are not clearing up.  States every morning a scab of sorts is peeled off from the area where the piercing was, and she is now concerned for infection and possible sepsis.
